# Patient Record
Sex: Male | Born: 2000 | Race: White | Hispanic: No | Marital: Single | Smoking: Never smoker
Health system: Southern US, Community
[De-identification: ages and names within clinical notes are randomized; demographics above are authoritative.]

## PROBLEM LIST (undated history)

## (undated) DIAGNOSIS — G43909 Migraine, unspecified, not intractable, without status migrainosus: Secondary | ICD-10-CM

## (undated) DIAGNOSIS — F32A Depression, unspecified: Secondary | ICD-10-CM

## (undated) DIAGNOSIS — G8929 Other chronic pain: Secondary | ICD-10-CM

## (undated) DIAGNOSIS — G919 Hydrocephalus, unspecified: Secondary | ICD-10-CM

## (undated) DIAGNOSIS — R519 Headache, unspecified: Secondary | ICD-10-CM

## (undated) DIAGNOSIS — F419 Anxiety disorder, unspecified: Secondary | ICD-10-CM

## (undated) HISTORY — PX: THIRD VENTRICULOSTOMY: SHX2497

## (undated) HISTORY — DX: Headache, unspecified: R51.9

## (undated) HISTORY — DX: Depression, unspecified: F32.A

## (undated) HISTORY — DX: Other chronic pain: G89.29

## (undated) HISTORY — DX: Migraine, unspecified, not intractable, without status migrainosus: G43.909

## (undated) HISTORY — PX: FRACTURE SURGERY: SHX138

## (undated) HISTORY — DX: Anxiety disorder, unspecified: F41.9

---
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
## 2022-02-22 ENCOUNTER — Other Ambulatory Visit: Payer: Self-pay | Admitting: Student

## 2022-02-22 DIAGNOSIS — R519 Other chronic pain: Secondary | ICD-10-CM

## 2022-03-03 ENCOUNTER — Other Ambulatory Visit: Payer: Self-pay | Admitting: Ophthalmology

## 2022-03-03 ENCOUNTER — Other Ambulatory Visit: Payer: Self-pay | Admitting: Student

## 2022-03-03 DIAGNOSIS — G8929 Other chronic pain: Secondary | ICD-10-CM

## 2022-12-02 IMAGING — CT CT HEAD W/O CM
3 series · 16 of 47 positions shown, 19 images · non-contrast
Comparison: CT head dated January 21, 2006.

CLINICAL DATA: Syncope while lifting weights earlier.

EXAM:
CT HEAD WITHOUT CONTRAST
TECHNIQUE: Contiguous axial images were obtained from the base of the skull
through the vertex without intravenous contrast.

[Series 2: head wo · axial · 0.44mm/px · z∈[+190,+325]mm · 10 of 33 slices shown, 13 images]
[im 3/33  brain]
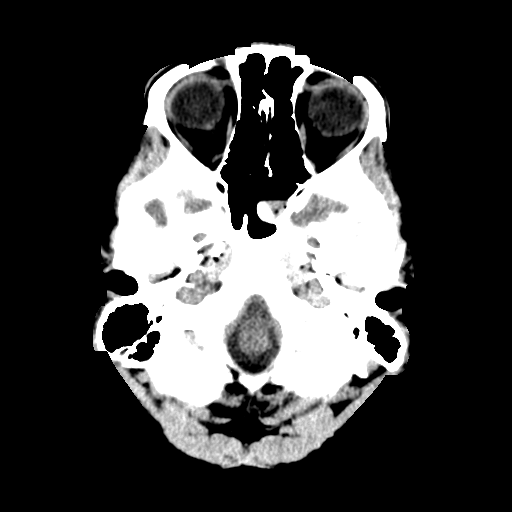
[im 3/33  bone]
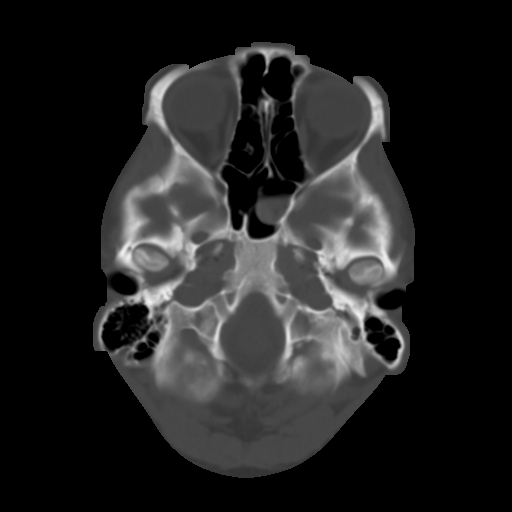
[im 6/33  brain]
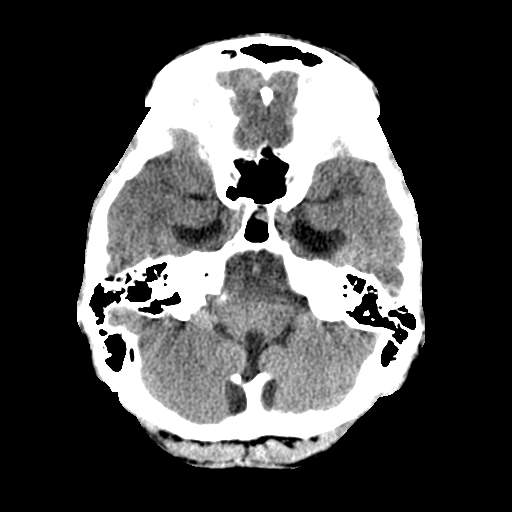
[im 9/33  brain]
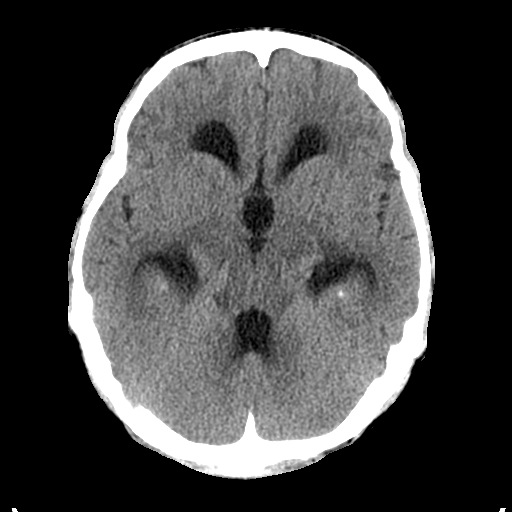
[im 12/33  brain]
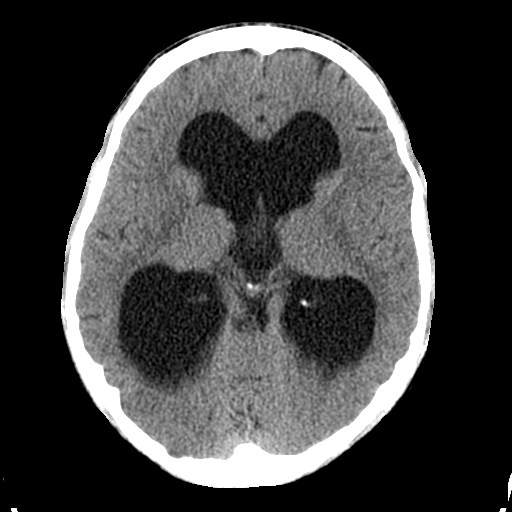
[im 15/33  brain]
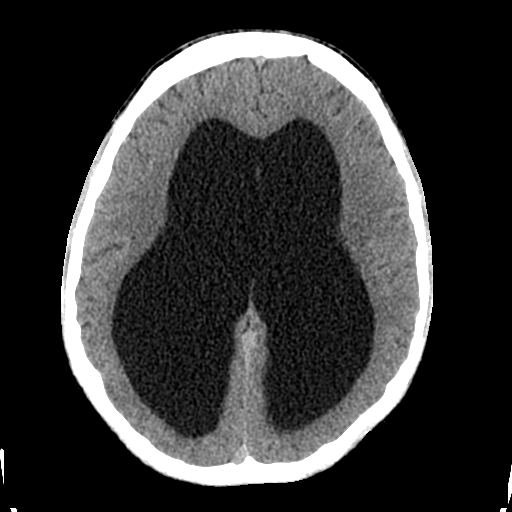
[im 15/33  bone]
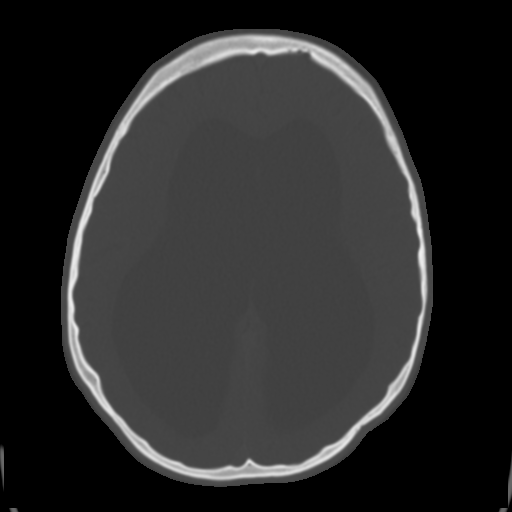
[im 18/33  brain]
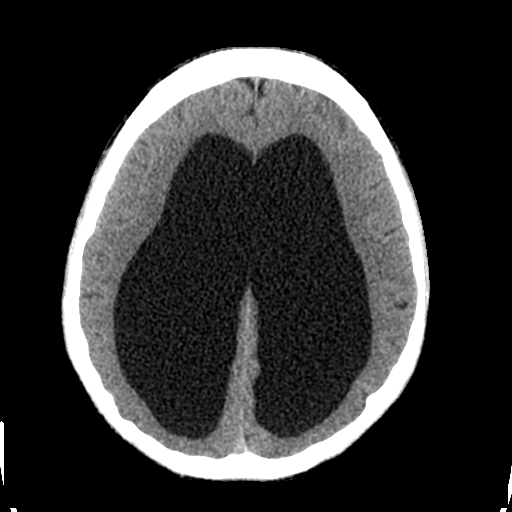
[im 21/33  brain]
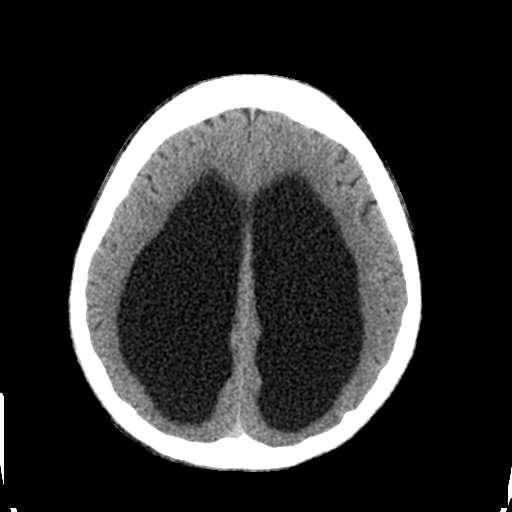
[im 25/33  brain]
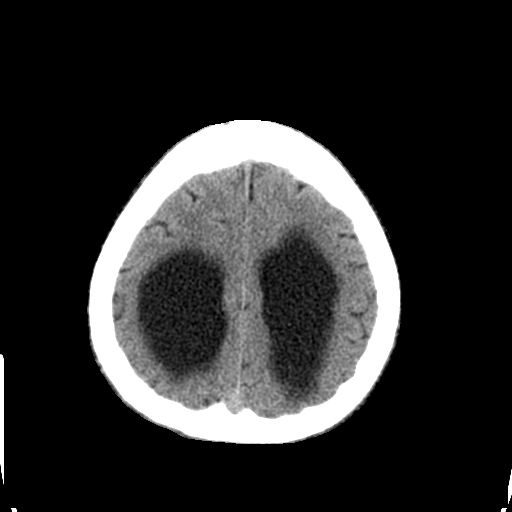
[im 27/33  brain]
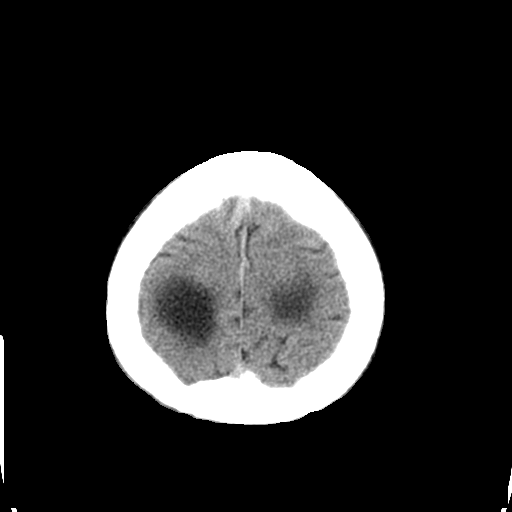
[im 27/33  bone]
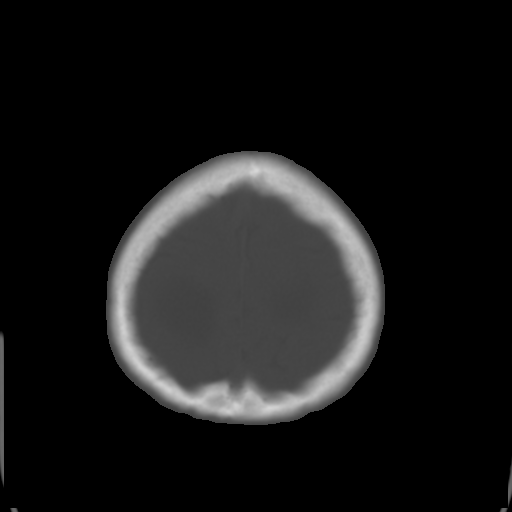
[im 30/33  brain]
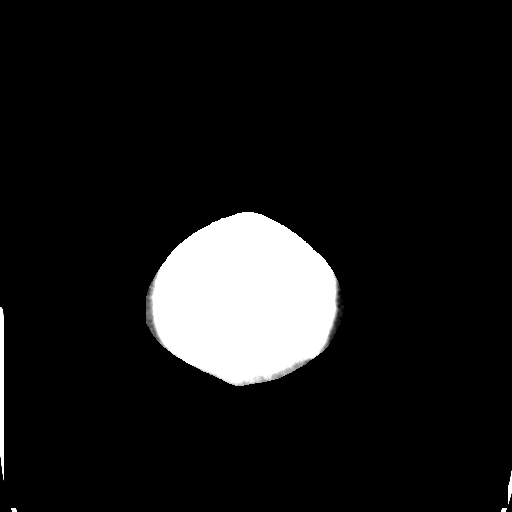

[Series 4: coronal soft tissue · coronal · 0.36mm/px · 3 of 72 slices shown]
[im 24/72  brain]
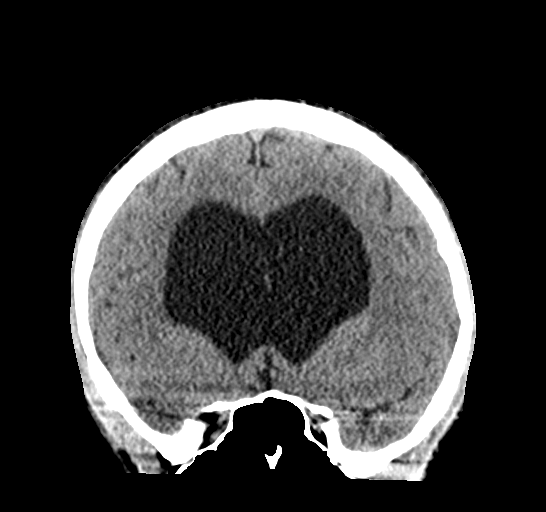
[im 32/72  brain]
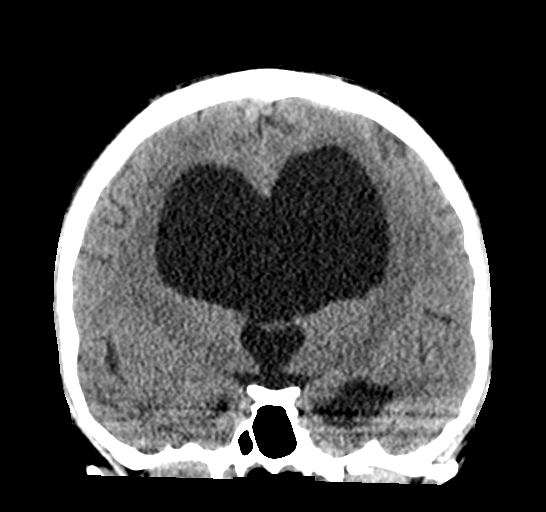
[im 40/72  brain]
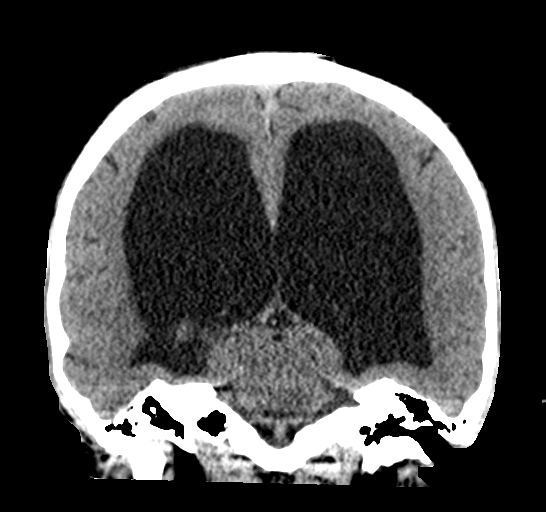

[Series 5: sagittal soft tissue · sagittal · 0.36mm/px · 3 of 61 slices shown]
[im 21/61  brain]
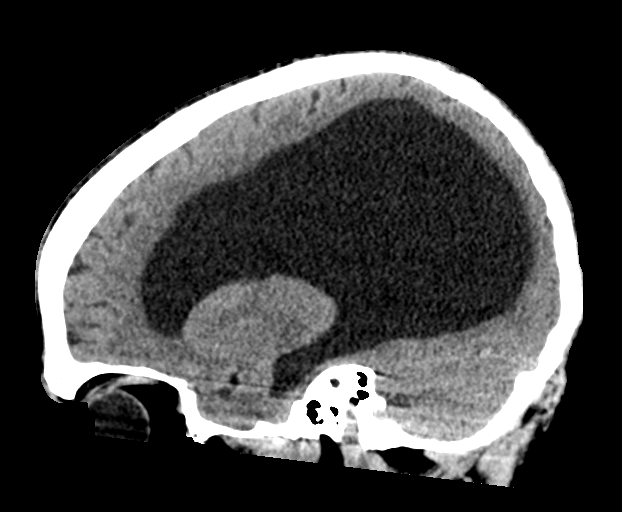
[im 31/61  brain]
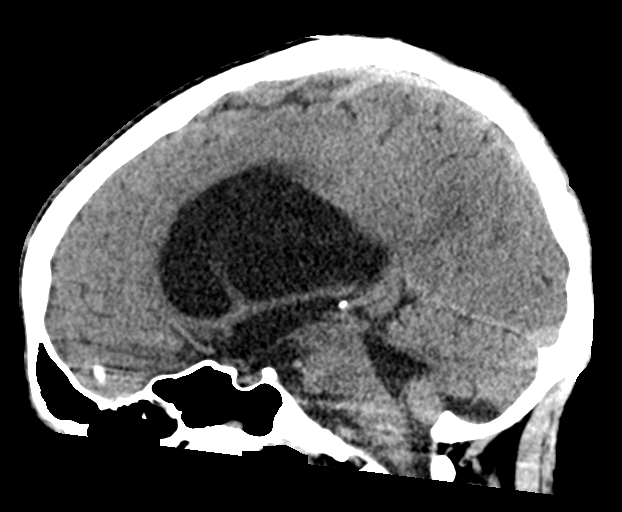
[im 41/61  brain]
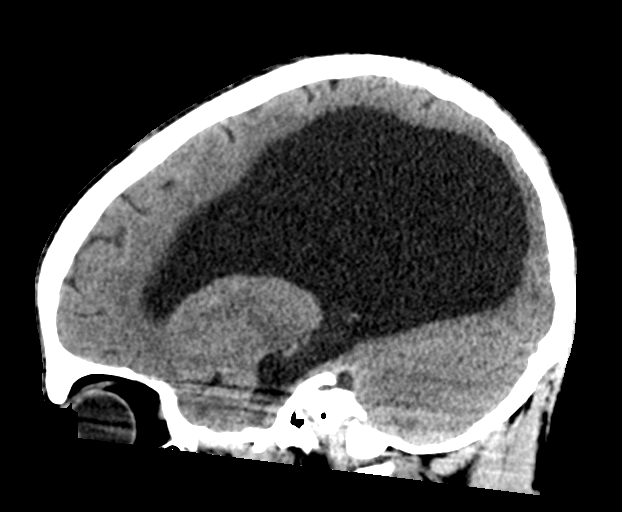

[16 of 47 positions shown; findings below may reference images not displayed]

FINDINGS: Brain: No evidence of acute infarction, hemorrhage, extra-axial
collection or mass lesion/mass effect. Chronic severe communicating
hydrocephalus has mildly worsened since 3001.

Vascular: No hyperdense vessel or unexpected calcification.

Skull: Normal. Negative for fracture or focal lesion.

Sinuses/Orbits: No acute finding. Retention cyst in the left
sphenoid sinus.

Other: None.
IMPRESSION: 1. No acute intracranial abnormality.
2. Chronic severe communicating hydrocephalus has mildly worsened
since [DATE].

## 2023-03-09 ENCOUNTER — Ambulatory Visit: Payer: Medicaid Other | Admitting: General Practice

## 2023-03-09 ENCOUNTER — Encounter: Payer: Self-pay | Admitting: General Practice

## 2023-03-09 ENCOUNTER — Other Ambulatory Visit: Payer: Self-pay | Admitting: General Practice

## 2023-03-09 VITALS — BP 120/82 | HR 91 | Temp 98.3°F | Ht 67.5 in | Wt 179.0 lb

## 2023-03-09 DIAGNOSIS — R0789 Other chest pain: Secondary | ICD-10-CM | POA: Diagnosis not present

## 2023-03-09 DIAGNOSIS — F419 Anxiety disorder, unspecified: Secondary | ICD-10-CM

## 2023-03-09 DIAGNOSIS — G919 Hydrocephalus, unspecified: Secondary | ICD-10-CM | POA: Diagnosis not present

## 2023-03-09 DIAGNOSIS — F32A Depression, unspecified: Secondary | ICD-10-CM

## 2023-03-09 DIAGNOSIS — R7989 Other specified abnormal findings of blood chemistry: Secondary | ICD-10-CM

## 2023-03-09 DIAGNOSIS — R002 Palpitations: Secondary | ICD-10-CM

## 2023-03-09 DIAGNOSIS — Z7689 Persons encountering health services in other specified circumstances: Secondary | ICD-10-CM

## 2023-03-09 LAB — COMPREHENSIVE METABOLIC PANEL
ALT: 128 U/L — ABNORMAL HIGH (ref 0–53)
AST: 57 U/L — ABNORMAL HIGH (ref 0–37)
Albumin: 4.8 g/dL (ref 3.5–5.2)
Alkaline Phosphatase: 72 U/L (ref 39–117)
BUN: 17 mg/dL (ref 6–23)
CO2: 32 meq/L (ref 19–32)
Calcium: 9.5 mg/dL (ref 8.4–10.5)
Chloride: 102 meq/L (ref 96–112)
Creatinine, Ser: 1 mg/dL (ref 0.40–1.50)
GFR: 108.27 mL/min (ref >60.00)
Glucose, Bld: 103 mg/dL — ABNORMAL HIGH (ref 70–99)
Potassium: 4.1 meq/L (ref 3.5–5.1)
Sodium: 141 meq/L (ref 135–145)
Total Bilirubin: 0.8 mg/dL (ref 0.2–1.2)
Total Protein: 7.3 g/dL (ref 6.0–8.3)

## 2023-03-09 LAB — CBC WITH DIFFERENTIAL/PLATELET
Basophils Absolute: 0.1 10*3/uL (ref 0.0–0.1)
Basophils Relative: 1.1 % (ref 0.0–3.0)
Eosinophils Absolute: 0 10*3/uL (ref 0.0–0.7)
Eosinophils Relative: 0.5 % (ref 0.0–5.0)
HCT: 45.2 % (ref 39.0–52.0)
Hemoglobin: 15.7 g/dL (ref 13.0–17.0)
Lymphocytes Relative: 28.7 % (ref 12.0–46.0)
Lymphs Abs: 1.7 10*3/uL (ref 0.7–4.0)
MCHC: 34.8 g/dL (ref 30.0–36.0)
MCV: 87.1 fL (ref 78.0–100.0)
Monocytes Absolute: 0.4 10*3/uL (ref 0.1–1.0)
Monocytes Relative: 7.4 % (ref 3.0–12.0)
Neutro Abs: 3.8 10*3/uL (ref 1.4–7.7)
Neutrophils Relative %: 62.3 % (ref 43.0–77.0)
Platelets: 228 10*3/uL (ref 150.0–400.0)
RBC: 5.19 Mil/uL (ref 4.22–5.81)
RDW: 12.6 % (ref 11.5–14.6)
WBC: 6 10*3/uL (ref 4.5–10.5)

## 2023-03-09 LAB — D-DIMER, QUANTITATIVE: D-Dimer, Quant: <0.19 ug{FEU}/mL (ref <0.50)

## 2023-03-09 LAB — C-REACTIVE PROTEIN: CRP: <1 mg/dL (ref 0.5–20.0)

## 2023-03-09 LAB — SEDIMENTATION RATE: Sed Rate: 5 mm/h (ref 0–15)

## 2023-03-09 LAB — TROPONIN I (HIGH SENSITIVITY): High Sens Troponin I: 4 ng/L (ref 2–17)

## 2023-03-09 MED ORDER — SERTRALINE HCL 25 MG PO TABS: 25.0000 mg | ORAL_TABLET | Freq: Every day | ORAL | 0 refills | Status: DC

## 2023-03-09 NOTE — Progress Notes (Signed)
New Patient Office Visit  Subjective    Patient ID: Jacob Medina, male    DOB: 09-05-2000  Age: 22 y.o. MRN: [id number]  CC:  Chief Complaint  Patient presents with   New Patient (Initial Visit)    HPI Jacob Medina is a 22 y.o. male presents to establish care. He attends [employer] and is in the automotive program. He is starting a new job tomorrow at [employer].   He has a history of headaches and migraines. He sees neurology at [facility] clinic. Currently managed on Nortriptyline 25 mg once daily and Rizatriptan 10 mg as needed. He is tolerating these well.   He has a history of anxiety. Dig nosed four years ago and was placed on medication but does not remember which one. He only took it for one month and then stopped it due to side effects. He tried physical therapy two years ago and it helped a little but he felt that talking to mother gave him more relief. He stopped after a short period of time. His anxiety symptoms include worrying a lot, pacing, palpitations, stressing easily, getting aggravated, unable to sleep and nausea. He has had 1-2 panic attacks 2-3 years ago due to bad relationship at the time. He has noticed since then he does get anxious easily.  He has seen cardiology for the palpitations and wore the zoll monitor and everything came back negative.   Right sided chest pain- reports pain started last week. He says the pain is dull and discomfort all time but worse with movement. He does have shortness of breath when pain is really bad, usually at night. He said yesterday was the worst out of the all of the days. Pain does not radiate to his left side or to his back. He denies any injury, trauma, moving or lifting anything heavy, sleeping in a uncomfortable position. He does report having a cold a week ago, still has a mild cough but overall better from those symptoms. He denies any smoking or drinking.  He was seen by cardiology last year at [facility] clinic  for tachycardia and all of the cardiac work up was negative.    Outpatient Encounter Medications as of 03/09/2023  Medication Sig   ibuprofen (ADVIL) 200 MG tablet Take 600 mg by mouth every 8 (eight) hours as needed.   nortriptyline (PAMELOR) 25 MG capsule Take 25 mg by mouth at bedtime.   ondansetron (ZOFRAN ODT) 4 MG disintegrating tablet Take 1 tablet (4 mg total) by mouth every 8 (eight) hours as needed for nausea or vomiting.   rizatriptan (MAXALT-MLT) 10 MG disintegrating tablet Take 10 mg by mouth as needed.   sertraline (ZOLOFT) 25 MG tablet Take 1 tablet (25 mg total) by mouth daily.   No facility-administered encounter medications on file as of 03/09/2023.    Past Medical History:  Diagnosis Date   Anxiety and depression    Chronic headaches    Hydrocephalus (HCC)    Migraines    Palpitations 10/11/2021    Past Surgical History:  Procedure Laterality Date   FRACTURE SURGERY     THIRD VENTRICULOSTOMY  2004    Family History  Problem Relation Age of Onset   Arthritis Mother    Anxiety disorder Mother    Hyperlipidemia Father    Diabetes Father     Social History   Socioeconomic History   Marital status: Single    Spouse name: Not on file   Number of  children: Not on file   Years of education: Not on file   Highest education level: Not on file  Occupational History   Occupation: student  Tobacco Use   Smoking status: Never   Smokeless tobacco: Never  Substance and Sexual Activity   Alcohol use: Not Currently   Drug use: Not Currently   Sexual activity: Not Currently    Partners: Female  Other Topics Concern   Not on file  Social History Narrative   Currently a Consulting civil engineer at [employer]      Starts at [employer] - [employer]   Social Drivers of Health   Financial Resource Strain: Not on file  Food Insecurity: Not on file  Transportation Needs: Not on file  Physical Activity: Not on file  Stress: Not on file  Social Connections:  Not on file  Intimate Partner Violence: Not on file    Review of Systems  Constitutional:  Positive for malaise/fatigue. Negative for chills and fever.  HENT:  Positive for tinnitus. Negative for congestion, sinus pain and sore throat.   Eyes:  Negative for blurred vision and pain.  Respiratory:  Positive for shortness of breath. Negative for cough and wheezing.   Cardiovascular:  Positive for chest pain. Negative for palpitations.  Gastrointestinal:  Negative for abdominal pain, blood in stool, constipation, diarrhea, heartburn, nausea and vomiting.  Genitourinary:  Negative for dysuria, frequency, hematuria and urgency.  Musculoskeletal:  Negative for back pain, joint pain, myalgias and neck pain.  Skin:  Negative for itching and rash.  Neurological:  Positive for headaches. Negative for dizziness, tingling and weakness.  Psychiatric/Behavioral:  Negative for depression. The patient is nervous/anxious.         Objective    BP 120/82 (BP Location: Left Arm, Patient Position: Sitting, Cuff Size: Normal)   Pulse 91   Temp 98.3 F (36.8 C) (Oral)   Ht 5' 7.5" (1.715 m)   Wt 179 lb (81.2 kg)   SpO2 97%   BMI 27.62 kg/m   Physical Exam Vitals and nursing note reviewed.  Constitutional:      Appearance: Normal appearance.  Cardiovascular:     Rate and Rhythm: Normal rate and regular rhythm.     Pulses: Normal pulses.     Heart sounds: Normal heart sounds.  Pulmonary:     Effort: Pulmonary effort is normal.     Breath sounds: Normal breath sounds.  Chest:     Chest wall: Tenderness present.       Comments: Right sided upper chest pain on palpation.  Musculoskeletal:        General: Tenderness present.  Neurological:     Mental Status: He is alert and oriented to person, place, and time.  Psychiatric:        Mood and Affect: Mood normal.        Behavior: Behavior normal.        Thought Content: Thought content normal.        Judgment: Judgment normal.          Assessment & Plan:  Intermittent right-sided chest pain Assessment & Plan: EKG tracing is personally reviewed.  EKG notes abnormal.  No acute changes. Compared to previous EKG from 2023. Discussed results with cardiology NP. She also thinks this could be MSK.   Differentials include MSK, pericarditis  CBC with diff, CMP, ESR, CRP, Troponin and d-dimer pending. Ordered STAT.   Will continue to monitor.  Orders: -     EKG 12-Lead -  C-reactive protein -     CBC with Differential/Platelet -     Comprehensive metabolic panel -     D-dimer, quantitative -     Sedimentation rate -     Troponin I (High Sensitivity)  Anxiety and depression Assessment & Plan:    03/09/2023   10:58 AM  Depression screen PHQ 2/9  Decreased Interest 1  Down, Depressed, Hopeless 0  PHQ - 2 Score 1  Altered sleeping 1  Tired, decreased energy 1  Change in appetite 2  Feeling bad or failure about yourself  0  Trouble concentrating 2  Moving slowly or fidgety/restless 2  Suicidal thoughts 0  PHQ-9 Score 9  Difficult doing work/chores Somewhat difficult      03/09/2023   10:58 AM  GAD 7 : Generalized Anxiety Score  Nervous, Anxious, on Edge 1  Control/stop worrying 1  Worry too much - different things 1  Trouble relaxing 1  Restless 1  Easily annoyed or irritable 1  Afraid - awful might happen 0  Total GAD 7 Score 6  Anxiety Difficulty Somewhat difficult    Treatment options Discussed at length with patient.   At this time, he does not want to try therapy. He said maybe in future.   He would like to start Sertraline 25 mg once daily. Discussed side effects with patient. Discussed serotonin syndrome adverse reactions with taking Pamelor 25 mg. Verbalizes understanding.   Follow up in 4 weeks.  Orders: -     Sertraline HCl; Take 1 tablet (25 mg total) by mouth daily.  Dispense: 30 tablet; Refill: 0  Encounter to establish care with new doctor Assessment & Plan: EMR reviewed.     Hydrocephalus, unspecified type [facility]) Assessment & Plan: Stable. Followed by neurology.   Palpitations Assessment & Plan: Resolved and stable.      Return in about 4 weeks (around 04/06/2023).   Modesto Charon, NP

## 2023-03-09 NOTE — Assessment & Plan Note (Signed)
Resolved and stable

## 2023-03-09 NOTE — Progress Notes (Signed)
Discussed results with patient over the phone.   All labs within normal limits other than liver functions.  He reports drinking alcohol very occasionally. Patient advised that we will repeat liver panel in 2 months.

## 2023-03-09 NOTE — Assessment & Plan Note (Signed)
Stable  Followed by neurology

## 2023-03-09 NOTE — Assessment & Plan Note (Signed)
EMR reviewed.

## 2023-03-09 NOTE — Assessment & Plan Note (Signed)
EKG tracing is personally reviewed.  EKG notes abnormal.  No acute changes. Compared to previous EKG from 2023. Discussed results with cardiology NP. She also thinks this could be MSK.   Differentials include MSK, pericarditis  CBC with diff, CMP, ESR, CRP, Troponin and d-dimer pending. Ordered STAT.   Will continue to monitor.

## 2023-03-09 NOTE — Patient Instructions (Signed)
Start sertraline 25 mg one tablet daily.   Stop by the lab prior to leaving today. I will notify you of your results once received.   It was a pleasure meeting you!

## 2023-03-09 NOTE — Assessment & Plan Note (Signed)
    03/09/2023   10:58 AM  Depression screen PHQ 2/9  Decreased Interest 1  Down, Depressed, Hopeless 0  PHQ - 2 Score 1  Altered sleeping 1  Tired, decreased energy 1  Change in appetite 2  Feeling bad or failure about yourself  0  Trouble concentrating 2  Moving slowly or fidgety/restless 2  Suicidal thoughts 0  PHQ-9 Score 9  Difficult doing work/chores Somewhat difficult      03/09/2023   10:58 AM  GAD 7 : Generalized Anxiety Score  Nervous, Anxious, on Edge 1  Control/stop worrying 1  Worry too much - different things 1  Trouble relaxing 1  Restless 1  Easily annoyed or irritable 1  Afraid - awful might happen 0  Total GAD 7 Score 6  Anxiety Difficulty Somewhat difficult    Treatment options Discussed at length with patient.   At this time, he does not want to try therapy. He said maybe in future.   He would like to start Sertraline 25 mg once daily. Discussed side effects with patient. Discussed serotonin syndrome adverse reactions with taking Pamelor 25 mg. Verbalizes understanding.   Follow up in 4 weeks.

## 2023-04-06 ENCOUNTER — Ambulatory Visit (INDEPENDENT_AMBULATORY_CARE_PROVIDER_SITE_OTHER): Payer: Medicaid Other | Admitting: General Practice

## 2023-04-06 ENCOUNTER — Telehealth: Payer: Self-pay

## 2023-04-06 ENCOUNTER — Encounter: Payer: Self-pay | Admitting: General Practice

## 2023-04-06 VITALS — BP 116/78 | HR 77 | Temp 97.9°F | Ht 67.5 in | Wt 177.2 lb

## 2023-04-06 DIAGNOSIS — F32A Depression, unspecified: Secondary | ICD-10-CM

## 2023-04-06 DIAGNOSIS — R7989 Other specified abnormal findings of blood chemistry: Secondary | ICD-10-CM | POA: Insufficient documentation

## 2023-04-06 DIAGNOSIS — R0789 Other chest pain: Secondary | ICD-10-CM

## 2023-04-06 DIAGNOSIS — F419 Anxiety disorder, unspecified: Secondary | ICD-10-CM | POA: Diagnosis not present

## 2023-04-06 MED ORDER — SERTRALINE HCL 50 MG PO TABS: 50.0000 mg | ORAL_TABLET | Freq: Every day | ORAL | 0 refills | Status: DC

## 2023-04-06 NOTE — Patient Instructions (Addendum)
 Start sertraline 50 mg one tablet daily. Prescription sent.   Follow up in 4-6 weeks for a physical, follow up on sertraline and liver functions.  It was a pleasure to see you today!

## 2023-04-06 NOTE — Assessment & Plan Note (Addendum)
 Resolved. Exam stable.   Consider cardiology referral if symptoms return.

## 2023-04-06 NOTE — Telephone Encounter (Signed)
 Called pt to see if he can come in early and pt stated he will come in now.

## 2023-04-06 NOTE — Assessment & Plan Note (Signed)
 Improving.      04/06/2023   10:47 AM 03/09/2023   10:58 AM  Depression screen PHQ 2/9  Decreased Interest 1 1  Down, Depressed, Hopeless 0 0  PHQ - 2 Score 1 1  Altered sleeping 1 1  Tired, decreased energy 1 1  Change in appetite 0 2  Feeling bad or failure about yourself  0 0  Trouble concentrating 0 2  Moving slowly or fidgety/restless 1 2  Suicidal thoughts 0 0  PHQ-9 Score 4 9  Difficult doing work/chores Not difficult at all Somewhat difficult      04/06/2023   10:47 AM 03/09/2023   10:58 AM  GAD 7 : Generalized Anxiety Score  Nervous, Anxious, on Edge 1 1  Control/stop worrying 0 1  Worry too much - different things 1 1  Trouble relaxing 1 1  Restless 1 1  Easily annoyed or irritable 1 1  Afraid - awful might happen 0 0  Total GAD 7 Score 5 6  Anxiety Difficulty Not difficult at all Somewhat difficult    Still having some worriness and pacing.   Increase sertraline  25 to 50 mg once daily. Rx sent.   Discussed the side effects of serotonin syndrome adver reactions with taking in combination with Pamelor 25 mg. Verbalizes understanding.   Follow up in four weeks.

## 2023-04-06 NOTE — Assessment & Plan Note (Signed)
 Unclear etiology.   Will recheck liver panel in 4 weeks.   Suspect this could be from Pamelor, which he takes for headaches. Discussed with patient.

## 2023-04-06 NOTE — Progress Notes (Signed)
 Established Patient Office Visit  Subjective   Patient ID: Jacob Medina, male    DOB: 2000-03-29  Age: 23 y.o. MRN: [id number]  Chief Complaint  Patient presents with   Medical Management of Chronic Issues    4 week follow up     HPI  Jacob Medina is a 23 year old male, with past medical history of anxiety and depression, intermittent right-sided chest pain, presents today for a follow up.   Right sided upper chest pain on palpation: He was evaluated on 03/09/23 for intermittent right sided chest pain which had been going on for one week. CBC, D-dimer, sed reate, troponin were collected and all were within normal limits. EKG was abnormal but was discussed with cardiology NP who said this could be MSK related. Today he reports that he is doing much better. No more pain, difficulty breathing or shortness of breath. He has not had any more episodes.   Anxiety and depression: He was evaluated on 03/09/23 and was started on sertraline  25 mg once daily. Today he reports that he has taking it daily. Did miss a couple of doses. He reports that his symptoms are improving. No panic attacks. Worrying is still but improved. Still pacing. No more palpitations. Denies SI/HI.   Abnormal LFTs- He had a liver panel completed on 03/09/23 which showed elevated AST/ALT. He denies excessive use of tylenol or ETOH intake.   Patient Active Problem List   Diagnosis Date Noted   Abnormal LFTs 04/06/2023   Intermittent right-sided chest pain 03/09/2023   Anxiety and depression 03/09/2023   Past Medical History:  Diagnosis Date   Anxiety and depression    Chronic headaches    Hydrocephalus (HCC)    Migraines    Palpitations 10/11/2021   No Known Allergies       04/06/2023   10:47 AM 03/09/2023   10:58 AM  Depression screen PHQ 2/9  Decreased Interest 1 1  Down, Depressed, Hopeless 0 0  PHQ - 2 Score 1 1  Altered sleeping 1 1  Tired, decreased energy 1 1  Change in appetite 0 2  Feeling bad or  failure about yourself  0 0  Trouble concentrating 0 2  Moving slowly or fidgety/restless 1 2  Suicidal thoughts 0 0  PHQ-9 Score 4 9  Difficult doing work/chores Not difficult at all Somewhat difficult       04/06/2023   10:47 AM 03/09/2023   10:58 AM  GAD 7 : Generalized Anxiety Score  Nervous, Anxious, on Edge 1 1  Control/stop worrying 0 1  Worry too much - different things 1 1  Trouble relaxing 1 1  Restless 1 1  Easily annoyed or irritable 1 1  Afraid - awful might happen 0 0  Total GAD 7 Score 5 6  Anxiety Difficulty Not difficult at all Somewhat difficult      Review of Systems  Constitutional:  Negative for chills and fever.  Respiratory:  Negative for shortness of breath.   Cardiovascular:  Negative for chest pain.  Psychiatric/Behavioral:  Positive for depression. The patient is nervous/anxious.       Objective:     BP 116/78   Pulse 77   Temp 97.9 F (36.6 C)   Ht 5' 7.5 (1.715 m)   Wt 177 lb 3.2 oz (80.4 kg)   SpO2 99%   BMI 27.34 kg/m  BP Readings from Last 3 Encounters:  04/06/23 116/78  03/09/23 120/82  07/23/20 120/68 (56%, Z =  0.15 /  48%, Z = -0.05)*   *BP percentiles are based on the 2017 AAP Clinical Practice Guideline for boys   Wt Readings from Last 3 Encounters:  04/06/23 177 lb 3.2 oz (80.4 kg)  03/09/23 179 lb (81.2 kg)  07/23/20 142 lb 11.2 oz (64.7 kg) (41%, Z= -0.23)*   * Growth percentiles are based on CDC (Boys, 2-20 Years) data.      Physical Exam Vitals and nursing note reviewed.  Constitutional:      Appearance: Normal appearance.  Cardiovascular:     Rate and Rhythm: Normal rate and regular rhythm.     Pulses: Normal pulses.     Heart sounds: Normal heart sounds.  Pulmonary:     Effort: Pulmonary effort is normal.     Breath sounds: Normal breath sounds.  Neurological:     Mental Status: He is alert and oriented to person, place, and time.  Psychiatric:        Mood and Affect: Mood normal.        Behavior:  Behavior normal.        Thought Content: Thought content normal.        Judgment: Judgment normal.      No results found for any visits on 04/06/23.     The ASCVD Risk score (Arnett DK, et al., 2019) failed to calculate for the following reasons:   The 2019 ASCVD risk score is only valid for ages 25 to 75    Assessment & Plan:  Anxiety and depression Assessment & Plan: Improving.      04/06/2023   10:47 AM 03/09/2023   10:58 AM  Depression screen PHQ 2/9  Decreased Interest 1 1  Down, Depressed, Hopeless 0 0  PHQ - 2 Score 1 1  Altered sleeping 1 1  Tired, decreased energy 1 1  Change in appetite 0 2  Feeling bad or failure about yourself  0 0  Trouble concentrating 0 2  Moving slowly or fidgety/restless 1 2  Suicidal thoughts 0 0  PHQ-9 Score 4 9  Difficult doing work/chores Not difficult at all Somewhat difficult      04/06/2023   10:47 AM 03/09/2023   10:58 AM  GAD 7 : Generalized Anxiety Score  Nervous, Anxious, on Edge 1 1  Control/stop worrying 0 1  Worry too much - different things 1 1  Trouble relaxing 1 1  Restless 1 1  Easily annoyed or irritable 1 1  Afraid - awful might happen 0 0  Total GAD 7 Score 5 6  Anxiety Difficulty Not difficult at all Somewhat difficult    Still having some worriness and pacing.   Increase sertraline  25 to 50 mg once daily. Rx sent.   Discussed the side effects of serotonin syndrome adver reactions with taking in combination with Pamelor 25 mg. Verbalizes understanding.   Follow up in four weeks.   Orders: -     Sertraline  HCl; Take 1 tablet (50 mg total) by mouth daily.  Dispense: 90 tablet; Refill: 0  Abnormal LFTs Assessment & Plan: Unclear etiology.   Will recheck liver panel in 4 weeks.   Suspect this could be from Pamelor, which he takes for headaches. Discussed with patient.      Intermittent right-sided chest pain Assessment & Plan: Resolved. Exam stable.   Consider cardiology referral if  symptoms return.      Return in about 4 weeks (around 05/04/2023) for physical and follow up on sertraline  and abnormal LFTs.SABRA  Carrol Aurora, NP

## 2023-05-04 ENCOUNTER — Ambulatory Visit: Payer: Medicaid Other | Admitting: General Practice

## 2023-05-04 ENCOUNTER — Encounter: Payer: Self-pay | Admitting: General Practice

## 2023-05-04 VITALS — BP 120/82 | HR 110 | Temp 98.3°F | Ht 68.5 in | Wt 171.0 lb

## 2023-05-04 DIAGNOSIS — F32A Depression, unspecified: Secondary | ICD-10-CM | POA: Diagnosis not present

## 2023-05-04 DIAGNOSIS — R0789 Other chest pain: Secondary | ICD-10-CM

## 2023-05-04 DIAGNOSIS — F419 Anxiety disorder, unspecified: Secondary | ICD-10-CM | POA: Diagnosis not present

## 2023-05-04 DIAGNOSIS — R7989 Other specified abnormal findings of blood chemistry: Secondary | ICD-10-CM

## 2023-05-04 LAB — HEPATIC FUNCTION PANEL
ALT: 46 U/L (ref 0–53)
AST: 26 U/L (ref 0–37)
Albumin: 4.7 g/dL (ref 3.5–5.2)
Alkaline Phosphatase: 83 U/L (ref 39–117)
Bilirubin, Direct: 0.1 mg/dL (ref 0.0–0.3)
Total Bilirubin: 0.5 mg/dL (ref 0.2–1.2)
Total Protein: 7.2 g/dL (ref 6.0–8.3)

## 2023-05-04 NOTE — Assessment & Plan Note (Addendum)
 Improved.   Continue sertraline  50 mg once daily.  Denies SI/HI.  Follow up in 6 months.     05/04/2023    8:18 AM 04/06/2023   10:47 AM 03/09/2023   10:58 AM  Depression screen PHQ 2/9  Decreased Interest 1 1 1   Down, Depressed, Hopeless 0 0 0  PHQ - 2 Score 1 1 1   Altered sleeping 0 1 1  Tired, decreased energy 1 1 1   Change in appetite 0 0 2  Feeling bad or failure about yourself  0 0 0  Trouble concentrating 1 0 2  Moving slowly or fidgety/restless 0 1 2  Suicidal thoughts 0 0 0  PHQ-9 Score 3 4 9   Difficult doing work/chores Not difficult at all Not difficult at all Somewhat difficult      05/04/2023    8:19 AM 04/06/2023   10:47 AM 03/09/2023   10:58 AM  GAD 7 : Generalized Anxiety Score  Nervous, Anxious, on Edge 1 1 1   Control/stop worrying 0 0 1  Worry too much - different things 0 1 1  Trouble relaxing 1 1 1   Restless 0 1 1  Easily annoyed or irritable 1 1 1   Afraid - awful might happen 0 0 0  Total GAD 7 Score 3 5 6   Anxiety Difficulty Not difficult at all Not difficult at all Somewhat difficult

## 2023-05-04 NOTE — Patient Instructions (Signed)
 Stop by the lab prior to leaving today. I will notify you of your results once received.   Follow up depending on lab results.   It was a pleasure to see you today!

## 2023-05-04 NOTE — Assessment & Plan Note (Signed)
 Unclear etiology.  Suspected could be contributed from medications.   Repeat liver profile today.  Await results.

## 2023-05-04 NOTE — Progress Notes (Signed)
 Established Patient Office Visit  Subjective   Patient ID: Jacob Medina, male    DOB: July 12, 2000  Age: 23 y.o. MRN: [id number]  Chief Complaint  Patient presents with   Annual Exam    HPI  Jacob Medina is a 23 year old male with past medical history of abnormal LFTs, anxiety and depression presents today for a follow up to discuss on anxiety and depression and elevated LFTs.   Anxiety and depression: Currently managed on Sertraline  50 mg once daily. Symptoms have improved. Still pacing a little but less often. No more palpitations. Worrying less now. Denies SI/HI.  Elevated LFTs- he does not drink alcohol. May one drink every couple of months but no drink since he was told his liver functions were elevated. He takes nortriptyline 25 mg once daily for his headaches. Reports that he does eat a diet that includes fatty, greasy foods. He denies any blurred vision, nausea, vomiting, blood in stool.   Patient Active Problem List   Diagnosis Date Noted   Abnormal LFTs 04/06/2023   Intermittent right-sided chest pain 03/09/2023   Anxiety and depression 03/09/2023   Past Medical History:  Diagnosis Date   Anxiety and depression    Chronic headaches    Hydrocephalus (HCC)    Migraines    Palpitations 10/11/2021   No Known Allergies       05/04/2023    8:18 AM 04/06/2023   10:47 AM 03/09/2023   10:58 AM  Depression screen PHQ 2/9  Decreased Interest 1 1 1   Down, Depressed, Hopeless 0 0 0  PHQ - 2 Score 1 1 1   Altered sleeping 0 1 1  Tired, decreased energy 1 1 1   Change in appetite 0 0 2  Feeling bad or failure about yourself  0 0 0  Trouble concentrating 1 0 2  Moving slowly or fidgety/restless 0 1 2  Suicidal thoughts 0 0 0  PHQ-9 Score 3 4 9   Difficult doing work/chores Not difficult at all Not difficult at all Somewhat difficult       05/04/2023    8:19 AM 04/06/2023   10:47 AM 03/09/2023   10:58 AM  GAD 7 : Generalized Anxiety Score  Nervous, Anxious, on Edge 1 1 1    Control/stop worrying 0 0 1  Worry too much - different things 0 1 1  Trouble relaxing 1 1 1   Restless 0 1 1  Easily annoyed or irritable 1 1 1   Afraid - awful might happen 0 0 0  Total GAD 7 Score 3 5 6   Anxiety Difficulty Not difficult at all Not difficult at all Somewhat difficult      Review of Systems  Constitutional:  Negative for chills and fever.  Respiratory:  Negative for shortness of breath.   Cardiovascular:  Negative for chest pain.  Gastrointestinal:  Negative for abdominal pain, constipation, diarrhea, heartburn, nausea and vomiting.  Genitourinary:  Negative for dysuria, frequency and urgency.  Neurological:  Negative for dizziness and headaches.  Endo/Heme/Allergies:  Negative for polydipsia.  Psychiatric/Behavioral:  Negative for depression and suicidal ideas. The patient is not nervous/anxious.       Objective:     BP 120/82 (BP Location: Left Arm, Patient Position: Sitting, Cuff Size: Normal)   Pulse (!) 110   Temp 98.3 F (36.8 C) (Oral)   Ht 5' 8.5 (1.74 m)   Wt 171 lb (77.6 kg)   SpO2 99%   BMI 25.62 kg/m  BP Readings from Last 3  Encounters:  05/04/23 120/82  04/06/23 116/78  03/09/23 120/82   Wt Readings from Last 3 Encounters:  05/04/23 171 lb (77.6 kg)  04/06/23 177 lb 3.2 oz (80.4 kg)  03/09/23 179 lb (81.2 kg)      Physical Exam Vitals and nursing note reviewed.  Constitutional:      Appearance: Normal appearance.  Cardiovascular:     Rate and Rhythm: Normal rate and regular rhythm.     Pulses: Normal pulses.     Heart sounds: Normal heart sounds.  Pulmonary:     Effort: Pulmonary effort is normal.     Breath sounds: Normal breath sounds.  Skin:    General: Skin is warm.     Capillary Refill: Capillary refill takes less than 2 seconds.  Neurological:     Mental Status: He is alert and oriented to person, place, and time.  Psychiatric:        Mood and Affect: Mood normal.        Behavior: Behavior normal.        Thought  Content: Thought content normal.        Judgment: Judgment normal.      No results found for any visits on 05/04/23.     The ASCVD Risk score (Arnett DK, et al., 2019) failed to calculate for the following reasons:   The 2019 ASCVD risk score is only valid for ages 105 to 1    Assessment & Plan:  Elevated LFTs -     Hepatic function panel  Abnormal LFTs Assessment & Plan: Unclear etiology.  Suspected could be contributed from medications.   Repeat liver profile today.  Await results.   Anxiety and depression Assessment & Plan: Improved.   Continue sertraline  50 mg once daily.  Denies SI/HI.  Follow up in 6 months.     05/04/2023    8:18 AM 04/06/2023   10:47 AM 03/09/2023   10:58 AM  Depression screen PHQ 2/9  Decreased Interest 1 1 1   Down, Depressed, Hopeless 0 0 0  PHQ - 2 Score 1 1 1   Altered sleeping 0 1 1  Tired, decreased energy 1 1 1   Change in appetite 0 0 2  Feeling bad or failure about yourself  0 0 0  Trouble concentrating 1 0 2  Moving slowly or fidgety/restless 0 1 2  Suicidal thoughts 0 0 0  PHQ-9 Score 3 4 9   Difficult doing work/chores Not difficult at all Not difficult at all Somewhat difficult      05/04/2023    8:19 AM 04/06/2023   10:47 AM 03/09/2023   10:58 AM  GAD 7 : Generalized Anxiety Score  Nervous, Anxious, on Edge 1 1 1   Control/stop worrying 0 0 1  Worry too much - different things 0 1 1  Trouble relaxing 1 1 1   Restless 0 1 1  Easily annoyed or irritable 1 1 1   Afraid - awful might happen 0 0 0  Total GAD 7 Score 3 5 6   Anxiety Difficulty Not difficult at all Not difficult at all Somewhat difficult       Intermittent right-sided chest pain Assessment & Plan: Controlled.  No more episodes since last visit.      Return in about 6 months (around 11/01/2023) for physical.    Carrol Aurora, NP

## 2023-05-04 NOTE — Assessment & Plan Note (Signed)
 Controlled.  No more episodes since last visit.

## 2023-06-06 DIAGNOSIS — G911 Obstructive hydrocephalus: Secondary | ICD-10-CM | POA: Diagnosis not present

## 2023-06-06 DIAGNOSIS — R519 Headache, unspecified: Secondary | ICD-10-CM | POA: Diagnosis not present

## 2023-06-08 ENCOUNTER — Other Ambulatory Visit: Payer: Self-pay | Admitting: Neurology

## 2023-06-08 DIAGNOSIS — G911 Obstructive hydrocephalus: Secondary | ICD-10-CM

## 2023-06-14 ENCOUNTER — Ambulatory Visit: Admission: RE | Admit: 2023-06-14 | Source: Ambulatory Visit

## 2023-06-14 ENCOUNTER — Encounter: Payer: Self-pay | Admitting: Radiology

## 2023-06-14 ENCOUNTER — Ambulatory Visit
Admission: RE | Admit: 2023-06-14 | Discharge: 2023-06-14 | Disposition: A | Discharge status: Home / Self Care | Source: Ambulatory Visit | Attending: Neurology | Admitting: Neurology

## 2023-06-14 DIAGNOSIS — G911 Obstructive hydrocephalus: Secondary | ICD-10-CM | POA: Diagnosis not present

## 2023-06-14 DIAGNOSIS — Z982 Presence of cerebrospinal fluid drainage device: Secondary | ICD-10-CM | POA: Diagnosis not present

## 2023-06-14 DIAGNOSIS — Q043 Other reduction deformities of brain: Secondary | ICD-10-CM | POA: Diagnosis not present

## 2023-06-14 DIAGNOSIS — R519 Headache, unspecified: Secondary | ICD-10-CM | POA: Diagnosis not present

## 2023-08-30 ENCOUNTER — Ambulatory Visit (INDEPENDENT_AMBULATORY_CARE_PROVIDER_SITE_OTHER): Admitting: General Practice

## 2023-08-30 ENCOUNTER — Encounter: Payer: Self-pay | Admitting: General Practice

## 2023-08-30 VITALS — BP 120/82 | HR 87 | Temp 98.2°F | Ht 68.5 in | Wt 173.0 lb

## 2023-08-30 DIAGNOSIS — J0110 Acute frontal sinusitis, unspecified: Secondary | ICD-10-CM | POA: Diagnosis not present

## 2023-08-30 DIAGNOSIS — F419 Anxiety disorder, unspecified: Secondary | ICD-10-CM | POA: Diagnosis not present

## 2023-08-30 DIAGNOSIS — H6121 Impacted cerumen, right ear: Secondary | ICD-10-CM | POA: Diagnosis not present

## 2023-08-30 DIAGNOSIS — F32A Depression, unspecified: Secondary | ICD-10-CM

## 2023-08-30 MED ORDER — AMOXICILLIN-POT CLAVULANATE 875-125 MG PO TABS: 1.0000 | ORAL_TABLET | Freq: Two times a day (BID) | ORAL | 0 refills | Status: AC

## 2023-08-30 MED ORDER — SERTRALINE HCL 50 MG PO TABS: 50.0000 mg | ORAL_TABLET | Freq: Every day | ORAL | 1 refills | Status: AC

## 2023-08-30 NOTE — Patient Instructions (Signed)
 Continue sertraline  50 mg once daily. Refill sent.   Start Augmentin antibiotics. Take 1 tablet by mouth twice daily for 7 days.  Follow up if symptoms worsen or do not improve.   It was a pleasure to see you today!

## 2023-08-30 NOTE — Assessment & Plan Note (Signed)
 Symptoms suggestive of frontal sinusitis.   Rx sent for augmentin for 7 days BID.  Discussed symptom management, rest and increase fluid intake.   He will follow up if symptoms worsen or do not improve.

## 2023-08-30 NOTE — Progress Notes (Signed)
 Established Patient Office Visit  Subjective   Patient ID: Jacob Medina, male    DOB: [birth year]  Age: 23 y.o. MRN: [id number]  Chief Complaint  Patient presents with   Cough    With Dizziness, HA, nausea, off balance, bilateral ear pain since may 16th. Dizziness and headaches have been for about 2 weeks. Patient has taken allergy medication and cold meds but nothing has really helped the sx.     HPI  Jacob Medina is a 23 year old male with past medical history of anxiety and depression; elevated LFTs presents today for an acute visit.   Cough: symptom onset was two weeks ago with runny nose, cough, post nasal drip, headache. Then sinus pressure and pain. Worsening last two days with off balance, bilateral ear pain, headaches. Near syncope but no actual syncopal episode. Cough with green mucus. Sinus pain with pressure initially and now slightly better.   Denies any fever, chills, chest pain, vomiting, shortness of breath or difficulty breathing.   Symptoms overall - somewhat better and somewhat worse.   No home testing.  No hx of smoking or asthma.   Patient Active Problem List   Diagnosis Date Noted   Impacted cerumen of right ear 08/30/2023   Acute non-recurrent frontal sinusitis 08/30/2023   Abnormal LFTs 04/06/2023   Intermittent right-sided chest pain 03/09/2023   Anxiety and depression 03/09/2023   Past Medical History:  Diagnosis Date   Anxiety and depression    Chronic headaches    Hydrocephalus (HCC)    Migraines    Palpitations 10/11/2021   Past Surgical History:  Procedure Laterality Date   FRACTURE SURGERY     THIRD VENTRICULOSTOMY  2004   No Known Allergies       08/30/2023    2:54 PM 05/04/2023    8:18 AM 04/06/2023   10:47 AM  Depression screen PHQ 2/9  Decreased Interest 1 1 1   Down, Depressed, Hopeless 0 0 0  PHQ - 2 Score 1 1 1   Altered sleeping 1 0 1  Tired, decreased energy 1 1 1   Change in appetite 0 0 0  Feeling bad or failure about  yourself  0 0 0  Trouble concentrating 1 1 0  Moving slowly or fidgety/restless 0 0 1  Suicidal thoughts 0 0 0  PHQ-9 Score 4 3 4   Difficult doing work/chores Not difficult at all Not difficult at all Not difficult at all       08/30/2023    2:54 PM 05/04/2023    8:19 AM 04/06/2023   10:47 AM 03/09/2023   10:58 AM  GAD 7 : Generalized Anxiety Score  Nervous, Anxious, on Edge 1 1 1 1   Control/stop worrying 0 0 0 1  Worry too much - different things 1 0 1 1  Trouble relaxing 0 1 1 1   Restless 1 0 1 1  Easily annoyed or irritable 1 1 1 1   Afraid - awful might happen 0 0 0 0  Total GAD 7 Score 4 3 5 6   Anxiety Difficulty Not difficult at all Not difficult at all Not difficult at all Somewhat difficult      Review of Systems  Constitutional:  Negative for chills and fever.  HENT:  Positive for congestion and ear pain. Negative for ear discharge, nosebleeds, sinus pain and sore throat.   Eyes:  Negative for blurred vision.  Respiratory:  Negative for shortness of breath.   Cardiovascular:  Negative for chest pain.  Gastrointestinal:  Positive for nausea. Negative for abdominal pain, constipation, diarrhea, heartburn and vomiting.  Genitourinary:  Negative for dysuria, frequency and urgency.  Neurological:  Negative for dizziness and headaches.  Endo/Heme/Allergies:  Negative for polydipsia.  Psychiatric/Behavioral:  Negative for depression and suicidal ideas. The patient is not nervous/anxious.       Objective:     BP 120/82 (BP Location: Left Arm, Patient Position: Sitting, Cuff Size: Normal)   Pulse 87   Temp 98.2 F (36.8 C) (Oral)   Ht 5' 8.5" (1.74 m)   Wt 173 lb (78.5 kg)   SpO2 97%   BMI 25.92 kg/m  BP Readings from Last 3 Encounters:  08/30/23 120/82  05/04/23 120/82  04/06/23 116/78   Wt Readings from Last 3 Encounters:  08/30/23 173 lb (78.5 kg)  05/04/23 171 lb (77.6 kg)  04/06/23 177 lb 3.2 oz (80.4 kg)      Physical Exam Vitals and nursing note  reviewed.  Constitutional:      Appearance: Normal appearance.  HENT:     Right Ear: There is impacted cerumen.     Left Ear: Ear canal and external ear normal. Tympanic membrane is erythematous.     Nose:     Right Sinus: Frontal sinus tenderness present. No maxillary sinus tenderness.     Left Sinus: Frontal sinus tenderness present. No maxillary sinus tenderness.     Mouth/Throat:     Pharynx: Oropharynx is clear.  Eyes:     Conjunctiva/sclera: Conjunctivae normal.  Cardiovascular:     Rate and Rhythm: Normal rate and regular rhythm.     Pulses: Normal pulses.     Heart sounds: Normal heart sounds.  Pulmonary:     Effort: Pulmonary effort is normal.     Breath sounds: Normal breath sounds.  Neurological:     Mental Status: He is alert and oriented to person, place, and time.  Psychiatric:        Mood and Affect: Mood normal.        Behavior: Behavior normal.        Thought Content: Thought content normal.        Judgment: Judgment normal.      No results found for any visits on 08/30/23.     The ASCVD Risk score (Arnett DK, et al., 2019) failed to calculate for the following reasons:   The 2019 ASCVD risk score is only valid for ages 72 to 52    Assessment & Plan:  Impacted cerumen of right ear Assessment & Plan: Right ear impaction identified on exam. Patient consented to irrigation of right canal.  Right canal irrigated. Patient tolerated well. TM and canal post irrigation unremarkable.   Discussed home care instructions. Discussed avoid using q-tips.   Anxiety and depression Assessment & Plan: Controlled.   Continue sertraline  50 mg once daily.  Denies SI/HI.   Refill sent.  Orders: -     Sertraline  HCl; Take 1 tablet (50 mg total) by mouth daily.  Dispense: 90 tablet; Refill: 1  Acute non-recurrent frontal sinusitis Assessment & Plan: Symptoms suggestive of frontal sinusitis.   Rx sent for augmentin for 7 days BID.  Discussed symptom  management, rest and increase fluid intake.   He will follow up if symptoms worsen or do not improve.  Orders: -     Amoxicillin-Pot Clavulanate; Take 1 tablet by mouth 2 (two) times daily for 7 days.  Dispense: 14 tablet; Refill: 0     Return if symptoms  worsen or fail to improve.    Jolanda Nation, NP

## 2023-08-30 NOTE — Assessment & Plan Note (Addendum)
 Right ear impaction identified on exam. Patient consented to irrigation of right canal.  Right canal irrigated. Patient tolerated well. TM and canal post irrigation unremarkable.   Discussed home care instructions. Discussed avoid using q-tips.

## 2023-08-30 NOTE — Assessment & Plan Note (Signed)
 Controlled.   Continue sertraline  50 mg once daily.  Denies SI/HI.   Refill sent.

## 2023-11-01 ENCOUNTER — Encounter: Payer: Self-pay | Admitting: General Practice

## 2023-11-01 ENCOUNTER — Ambulatory Visit: Payer: Self-pay | Admitting: General Practice

## 2023-11-01 ENCOUNTER — Ambulatory Visit (INDEPENDENT_AMBULATORY_CARE_PROVIDER_SITE_OTHER): Payer: Medicaid Other | Admitting: General Practice

## 2023-11-01 VITALS — BP 116/82 | HR 91 | Temp 98.0°F | Ht 68.5 in | Wt 176.0 lb

## 2023-11-01 DIAGNOSIS — F32A Depression, unspecified: Secondary | ICD-10-CM | POA: Diagnosis not present

## 2023-11-01 DIAGNOSIS — F419 Anxiety disorder, unspecified: Secondary | ICD-10-CM | POA: Diagnosis not present

## 2023-11-01 DIAGNOSIS — Z114 Encounter for screening for human immunodeficiency virus [HIV]: Secondary | ICD-10-CM

## 2023-11-01 DIAGNOSIS — Z0000 Encounter for general adult medical examination without abnormal findings: Secondary | ICD-10-CM | POA: Insufficient documentation

## 2023-11-01 DIAGNOSIS — R519 Headache, unspecified: Secondary | ICD-10-CM | POA: Diagnosis not present

## 2023-11-01 DIAGNOSIS — Z1159 Encounter for screening for other viral diseases: Secondary | ICD-10-CM | POA: Diagnosis not present

## 2023-11-01 LAB — COMPREHENSIVE METABOLIC PANEL WITH GFR
ALT: 46 U/L (ref 0–53)
AST: 26 U/L (ref 0–37)
Albumin: 4.7 g/dL (ref 3.5–5.2)
Alkaline Phosphatase: 63 U/L (ref 39–117)
BUN: 13 mg/dL (ref 6–23)
CO2: 33 meq/L — ABNORMAL HIGH (ref 19–32)
Calcium: 9.6 mg/dL (ref 8.4–10.5)
Chloride: 102 meq/L (ref 96–112)
Creatinine, Ser: 0.97 mg/dL (ref 0.40–1.50)
GFR: 111.79 mL/min (ref >60.00)
Glucose, Bld: 74 mg/dL (ref 70–99)
Potassium: 3.9 meq/L (ref 3.5–5.1)
Sodium: 140 meq/L (ref 135–145)
Total Bilirubin: 0.5 mg/dL (ref 0.2–1.2)
Total Protein: 7.3 g/dL (ref 6.0–8.3)

## 2023-11-01 LAB — CBC
HCT: 48.1 % (ref 39.0–52.0)
Hemoglobin: 16.3 g/dL (ref 13.0–17.0)
MCHC: 34 g/dL (ref 30.0–36.0)
MCV: 86.4 fl (ref 78.0–100.0)
Platelets: 195 K/uL (ref 150.0–400.0)
RBC: 5.56 Mil/uL (ref 4.22–5.81)
RDW: 12.5 % (ref 11.5–15.5)
WBC: 5.8 K/uL (ref 4.0–10.5)

## 2023-11-01 NOTE — Assessment & Plan Note (Signed)
Immunizations UTD.  Discussed the importance of a healthy diet and regular exercise in order for weight loss, and to reduce the risk of further co-morbidity.  Exam stable. Labs pending.  Follow up in 1 year for repeat physical.  

## 2023-11-01 NOTE — Progress Notes (Signed)
 Established Patient Office Visit  Subjective   Patient ID: Jacob Medina, male    DOB: 06-Aug-2000  Age: 23 y.o. MRN: [id number]  Chief Complaint  Patient presents with   Annual Exam    HPI  Jacob Medina is a 23 year old male with past medical history of elevated LFTs, anxiety and depression presents today for complete physical and follow up of chronic conditions.  Immunizations: -Tetanus: Completed in 2016 -Pneumonia vaccine: UTD.  Diet: Fair diet.  Exercise: No regular exercise.  Eye exam: Completes annually  Dental exam: Completed several years ago.   Patient Active Problem List   Diagnosis Date Noted   Chronic daily headache 11/01/2023   Encounter for screening and preventative care 11/01/2023   Abnormal LFTs 04/06/2023   Anxiety and depression 03/09/2023   Past Medical History:  Diagnosis Date   Anxiety and depression    Chronic headaches    Hydrocephalus (HCC)    Migraines    Palpitations 10/11/2021   Past Surgical History:  Procedure Laterality Date   FRACTURE SURGERY     THIRD VENTRICULOSTOMY  2004   No Known Allergies       11/01/2023   10:25 AM 08/30/2023    2:54 PM 05/04/2023    8:18 AM  Depression screen PHQ 2/9  Decreased Interest 1 1 1   Down, Depressed, Hopeless 0 0 0  PHQ - 2 Score 1 1 1   Altered sleeping 1 1 0  Tired, decreased energy 1 1 1   Change in appetite 0 0 0  Feeling bad or failure about yourself  0 0 0  Trouble concentrating 1 1 1   Moving slowly or fidgety/restless 0 0 0  Suicidal thoughts 0 0 0  PHQ-9 Score 4 4 3   Difficult doing work/chores Not difficult at all Not difficult at all Not difficult at all       11/01/2023   10:26 AM 08/30/2023    2:54 PM 05/04/2023    8:19 AM 04/06/2023   10:47 AM  GAD 7 : Generalized Anxiety Score  Nervous, Anxious, on Edge 1 1 1 1   Control/stop worrying 0 0 0 0  Worry too much - different things 0 1 0 1  Trouble relaxing 1 0 1 1  Restless 1 1 0 1  Easily annoyed or irritable 1 1 1 1   Afraid -  awful might happen 0 0 0 0  Total GAD 7 Score 4 4 3 5   Anxiety Difficulty Not difficult at all Not difficult at all Not difficult at all Not difficult at all      Review of Systems  Constitutional:  Negative for chills, fever, malaise/fatigue and weight loss.  HENT:  Negative for congestion, ear discharge, ear pain, hearing loss, nosebleeds, sinus pain, sore throat and tinnitus.   Eyes:  Negative for blurred vision, double vision, pain, discharge and redness.  Respiratory:  Negative for cough, shortness of breath, wheezing and stridor.   Cardiovascular:  Negative for chest pain, palpitations and leg swelling.  Gastrointestinal:  Negative for abdominal pain, constipation, diarrhea, heartburn, nausea and vomiting.  Genitourinary:  Negative for dysuria, frequency and urgency.  Musculoskeletal:  Negative for myalgias.  Skin:  Negative for rash.  Neurological:  Negative for dizziness, tingling, seizures, weakness and headaches.  Psychiatric/Behavioral:  Negative for depression, substance abuse and suicidal ideas. The patient is not nervous/anxious.       Objective:     BP 116/82   Pulse 91   Temp 98 F (  36.7 C) (Oral)   Ht 5' 8.5 (1.74 m)   Wt 176 lb (79.8 kg)   SpO2 99%   BMI 26.37 kg/m  BP Readings from Last 3 Encounters:  11/01/23 116/82  08/30/23 120/82  05/04/23 120/82   Wt Readings from Last 3 Encounters:  11/01/23 176 lb (79.8 kg)  08/30/23 173 lb (78.5 kg)  05/04/23 171 lb (77.6 kg)      Physical Exam Vitals and nursing note reviewed.  Constitutional:      Appearance: Normal appearance.  HENT:     Head: Normocephalic and atraumatic.     Right Ear: Tympanic membrane, ear canal and external ear normal.     Left Ear: Tympanic membrane, ear canal and external ear normal.     Nose: Nose normal.     Mouth/Throat:     Mouth: Mucous membranes are moist.     Pharynx: Oropharynx is clear.  Eyes:     Conjunctiva/sclera: Conjunctivae normal.     Pupils: Pupils are  equal, round, and reactive to light.  Cardiovascular:     Rate and Rhythm: Normal rate and regular rhythm.     Pulses: Normal pulses.     Heart sounds: Normal heart sounds.  Pulmonary:     Effort: Pulmonary effort is normal.     Breath sounds: Normal breath sounds.  Abdominal:     General: Abdomen is flat. Bowel sounds are normal.     Palpations: Abdomen is soft.  Musculoskeletal:        General: Normal range of motion.     Cervical back: Normal range of motion.  Skin:    General: Skin is warm and dry.     Capillary Refill: Capillary refill takes less than 2 seconds.  Neurological:     General: No focal deficit present.     Mental Status: He is alert and oriented to person, place, and time. Mental status is at baseline.     Deep Tendon Reflexes:     Reflex Scores:      Patellar reflexes are 2+ on the right side and 2+ on the left side. Psychiatric:        Mood and Affect: Mood normal.        Behavior: Behavior normal.        Thought Content: Thought content normal.        Judgment: Judgment normal.      No results found for any visits on 11/01/23.     The ASCVD Risk score (Arnett DK, et al., 2019) failed to calculate for the following reasons:   The 2019 ASCVD risk score is only valid for ages 19 to 76    Assessment & Plan:  Encounter for screening and preventative care Assessment & Plan: Immunizations UTD.  Discussed the importance of a healthy diet and regular exercise in order for weight loss, and to reduce the risk of further co-morbidity.  Exam stable. Labs pending.  Follow up in 1 year for repeat physical.  Orders: -     CBC -     Comprehensive metabolic panel with GFR  Anxiety and depression Assessment & Plan: Controlled.   Continue sertraline  50 mg once daily.  Denies SI/HI.   Declines refill today.   Chronic daily headache Assessment & Plan: Controlled.  Following with neurology.   Continue Pamelor 25 mg once daily and Maxalt 10 mg PRN.     Need for hepatitis C screening test -     Hepatitis C antibody  Screening for HIV (human immunodeficiency virus) -     HIV Antibody (routine testing w rflx)     Return in about 1 year (around 10/31/2024) for physical.    Carrol Aurora, NP

## 2023-11-01 NOTE — Assessment & Plan Note (Signed)
 Controlled.   Continue sertraline  50 mg once daily.  Denies SI/HI.   Declines refill today.

## 2023-11-01 NOTE — Assessment & Plan Note (Signed)
 Controlled.  Following with neurology.   Continue Pamelor 25 mg once daily and Maxalt 10 mg PRN.

## 2023-11-01 NOTE — Patient Instructions (Signed)
 Stop by the lab prior to leaving today. I will notify you of your results once received.   Continue sertraline  50 mg once daily. Let me know when you need refills.   Follow up in one year for physical.   It was a pleasure to see you today!

## 2023-11-02 LAB — HIV ANTIBODY (ROUTINE TESTING W REFLEX): HIV 1&2 Ab, 4th Generation: NONREACTIVE

## 2023-11-02 LAB — HEPATITIS C ANTIBODY: Hepatitis C Ab: NONREACTIVE

## 2023-11-24 DIAGNOSIS — H5213 Myopia, bilateral: Secondary | ICD-10-CM | POA: Diagnosis not present

## 2023-12-07 DIAGNOSIS — R519 Headache, unspecified: Secondary | ICD-10-CM | POA: Diagnosis not present

## 2023-12-07 DIAGNOSIS — G911 Obstructive hydrocephalus: Secondary | ICD-10-CM | POA: Diagnosis not present

## 2023-12-07 DIAGNOSIS — G9389 Other specified disorders of brain: Secondary | ICD-10-CM | POA: Diagnosis not present

## 2023-12-11 DIAGNOSIS — R0981 Nasal congestion: Secondary | ICD-10-CM | POA: Diagnosis not present

## 2023-12-11 DIAGNOSIS — J029 Acute pharyngitis, unspecified: Secondary | ICD-10-CM | POA: Diagnosis not present

## 2024-11-01 ENCOUNTER — Encounter: Admitting: General Practice
